# Patient Record
Sex: Female | Born: 1975 | Race: White | Hispanic: No | Marital: Married | Smoking: Never smoker
Health system: Southern US, Community
[De-identification: ages and names within clinical notes are randomized; demographics above are authoritative.]

---

## 2012-11-15 ENCOUNTER — Emergency Department: Payer: Self-pay | Admitting: Internal Medicine

## 2012-11-15 LAB — COMPREHENSIVE METABOLIC PANEL
Albumin: 3 g/dL — ABNORMAL LOW (ref 3.4–5.0)
Alkaline Phosphatase: 57 U/L (ref 50–136)
Anion Gap: 8 (ref 7–16)
Chloride: 106 mmol/L (ref 98–107)
Co2: 24 mmol/L (ref 21–32)
Creatinine: 0.61 mg/dL (ref 0.60–1.30)
Potassium: 3.5 mmol/L (ref 3.5–5.1)
SGOT(AST): 13 U/L — ABNORMAL LOW (ref 15–37)
SGPT (ALT): 14 U/L (ref 12–78)
Total Protein: 6.7 g/dL (ref 6.4–8.2)

## 2012-11-15 LAB — CBC
HCT: 33.1 % — ABNORMAL LOW (ref 35.0–47.0)
HGB: 11.6 g/dL — ABNORMAL LOW (ref 12.0–16.0)
MCH: 31.4 pg (ref 26.0–34.0)
MCV: 90 fL (ref 80–100)
RBC: 3.7 10*6/uL — ABNORMAL LOW (ref 3.80–5.20)
RDW: 13 % (ref 11.5–14.5)
WBC: 9 10*3/uL (ref 3.6–11.0)

## 2012-11-15 LAB — URINALYSIS, COMPLETE
Bilirubin,UR: NEGATIVE
Glucose,UR: NEGATIVE mg/dL (ref 0–75)
Nitrite: NEGATIVE
Ph: 9 (ref 4.5–8.0)
Protein: NEGATIVE
RBC,UR: NONE SEEN /HPF (ref 0–5)
Specific Gravity: 1.004 (ref 1.003–1.030)
Squamous Epithelial: 1
WBC UR: 1 /HPF (ref 0–5)

## 2013-04-18 ENCOUNTER — Inpatient Hospital Stay: Payer: Self-pay | Admitting: Obstetrics and Gynecology

## 2013-04-18 LAB — DRUG SCREEN, URINE
Amphetamines, Ur Screen: NEGATIVE (ref ?–1000)
Barbiturates, Ur Screen: NEGATIVE (ref ?–200)
Benzodiazepine, Ur Scrn: NEGATIVE (ref ?–200)
Cocaine Metabolite,Ur ~~LOC~~: NEGATIVE (ref ?–300)
MDMA (Ecstasy)Ur Screen: NEGATIVE (ref ?–500)
Opiate, Ur Screen: NEGATIVE (ref ?–300)
Phencyclidine (PCP) Ur S: NEGATIVE (ref ?–25)

## 2013-04-18 LAB — CBC WITH DIFFERENTIAL/PLATELET
Basophil #: 0.1 10*3/uL (ref 0.0–0.1)
Basophil %: 0.5 %
Eosinophil #: 0 10*3/uL (ref 0.0–0.7)
Lymphocyte #: 1.3 10*3/uL (ref 1.0–3.6)
Lymphocyte %: 9.5 %
MCV: 91 fL (ref 80–100)
Monocyte %: 6.1 %
Platelet: 140 10*3/uL — ABNORMAL LOW (ref 150–440)
RBC: 3.85 10*6/uL (ref 3.80–5.20)
WBC: 14 10*3/uL — ABNORMAL HIGH (ref 3.6–11.0)

## 2013-04-18 LAB — RAPID HIV-1/2 QL/CONFIRM: HIV-1/2,Rapid Ql: NEGATIVE

## 2013-04-19 LAB — GC/CHLAMYDIA PROBE AMP

## 2014-07-08 LAB — OB RESULTS CONSOLE HEPATITIS B SURFACE ANTIGEN: Hepatitis B Surface Ag: NEGATIVE

## 2014-07-08 LAB — OB RESULTS CONSOLE RPR: RPR: NONREACTIVE

## 2014-07-08 LAB — OB RESULTS CONSOLE HIV ANTIBODY (ROUTINE TESTING): HIV: NONREACTIVE

## 2014-07-08 LAB — OB RESULTS CONSOLE ABO/RH: RH Type: POSITIVE

## 2014-07-08 LAB — OB RESULTS CONSOLE VARICELLA ZOSTER ANTIBODY, IGG: VARICELLA IGG: IMMUNE

## 2014-07-08 LAB — OB RESULTS CONSOLE ANTIBODY SCREEN: ANTIBODY SCREEN: NEGATIVE

## 2014-07-08 LAB — OB RESULTS CONSOLE RUBELLA ANTIBODY, IGM: Rubella: IMMUNE

## 2014-08-19 NOTE — L&D Delivery Note (Signed)
Deliver Note   Date of Delivery:   03/02/2015 Primary OB:   [facility] Gestational Age/EDD: 4851w1d by 03/15/2015, by Last Menstrual Period  Antepartum complications:  OB History    Gravida Para Term Preterm AB TAB SAB Ectopic Multiple Living   4 3        3       Delivered By:   Vena AustriaStaebler, Meesha Sek MD  Delivery Type:   NSVD Anesthesia:     none  Intrapartum complications:  GBS:    Unknown Laceration:    None Episiotomy:    none Placenta:    Spontaneous Estimated Blood Loss:  250mL Baby:    Liveborn female , APGAR (1 MIN):  9 APGAR (5 MINS): 9  APGAR (10 MINS):  , weight pendin   Deliver Details   A liveborn  female was delivered via NSVD (Presentation: OA  ).  APGAR: 9, 9; weight pending.   Placenta status: spontaneous, intact . 3 vessel Cord:  without complications: Marland Kitchen.    Mom to postpartum.  Baby to Couplet care / Skin to Skin.

## 2014-08-28 IMAGING — US US RENAL KIDNEY
1 series · 14 of 25 positions shown · non-contrast
Comparison: none

REASON FOR EXAM: left flank and groin pain
COMMENTS:

PROCEDURE:     US  - US KIDNEY  - November 15, 2012 [DATE]
RESULT:     Bilateral renal ultrasound dated 11/15/2012.
TECHNIQUE: Grayscale, color flow and Doppler, and spectral waveform imaging
was performed the right and left kidneys.

[Series 1: us renal kidney · 0.23mm/px · 14 of 37 slices shown]
[im 1/37]
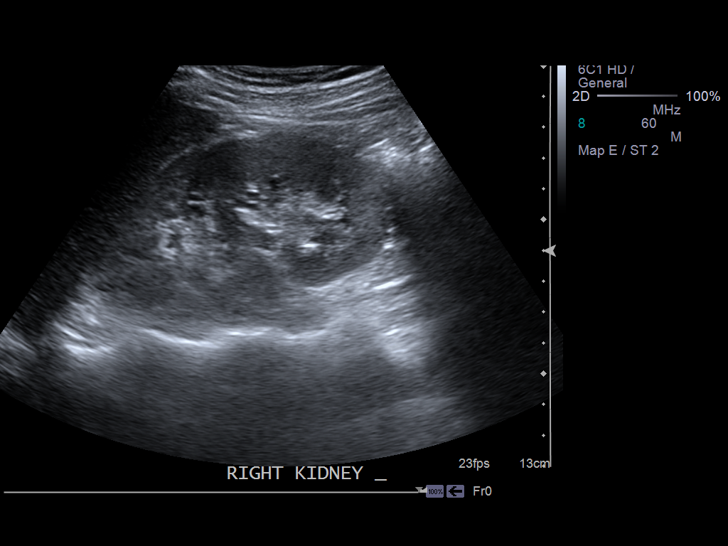
[im 4/37]
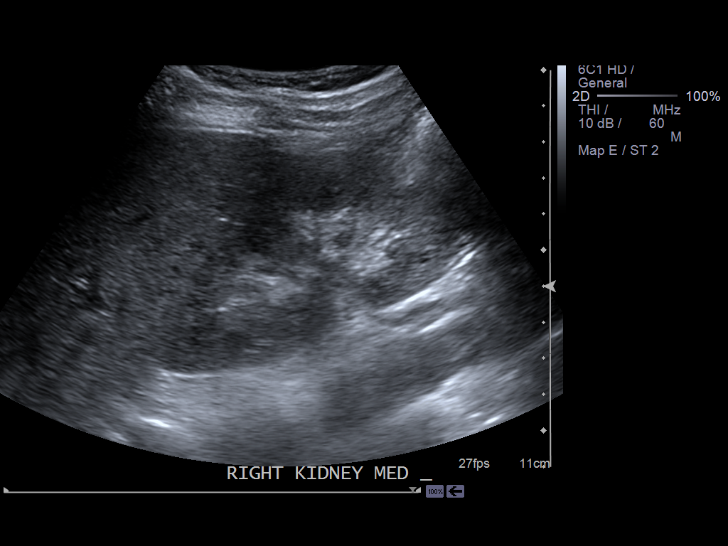
[im 7/37]
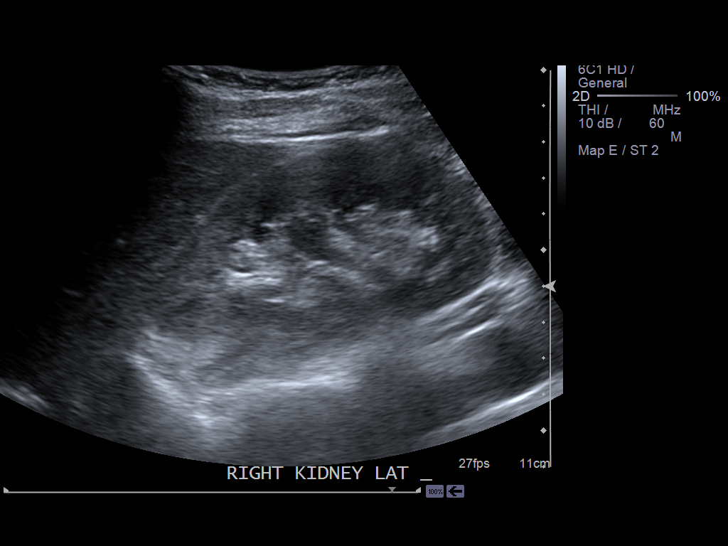
[im 10/37]
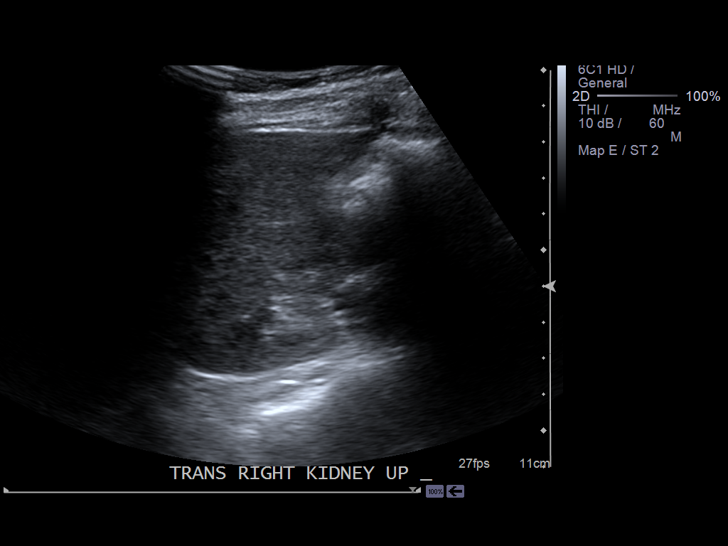
[im 13/37]
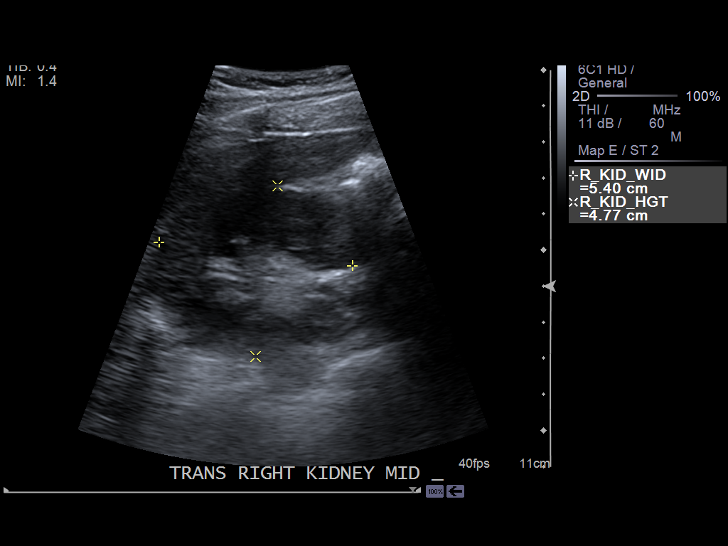
[im 14/37]
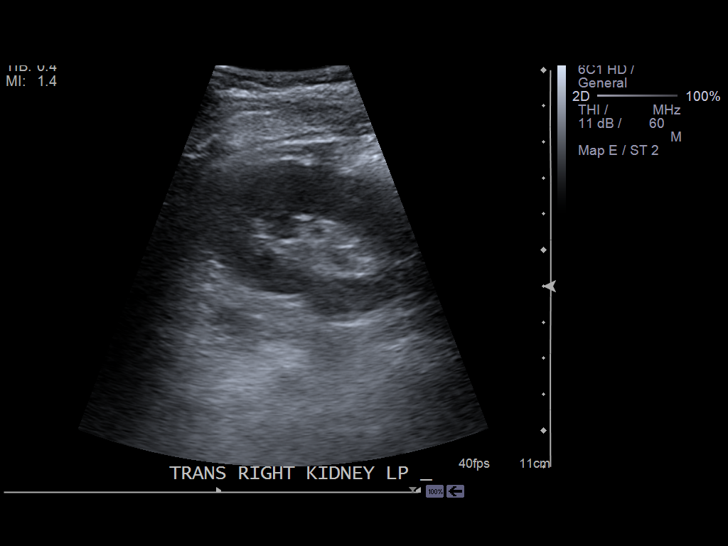
[im 17/37]
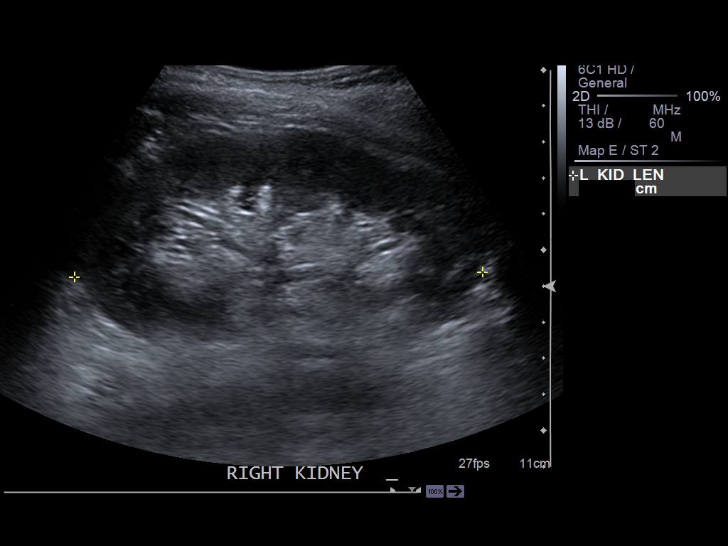
[im 20/37]
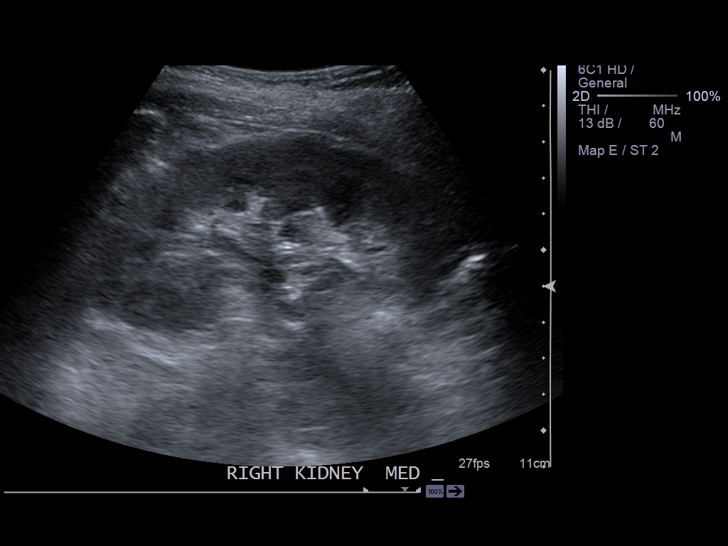
[im 23/37]
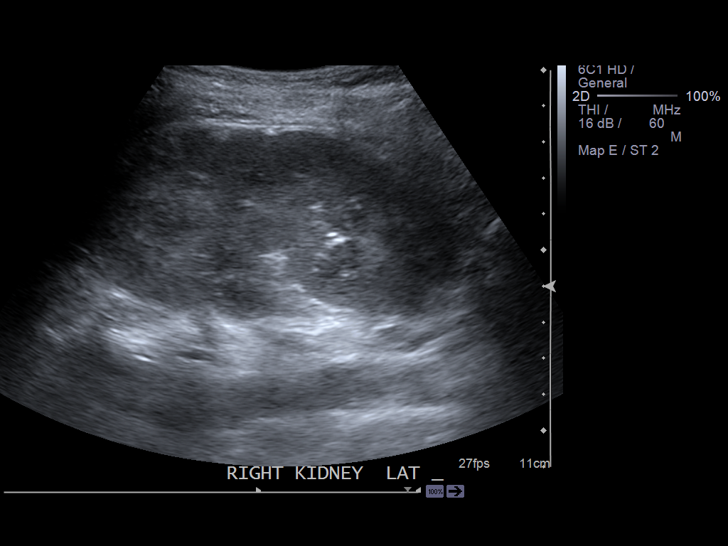
[im 25/37]
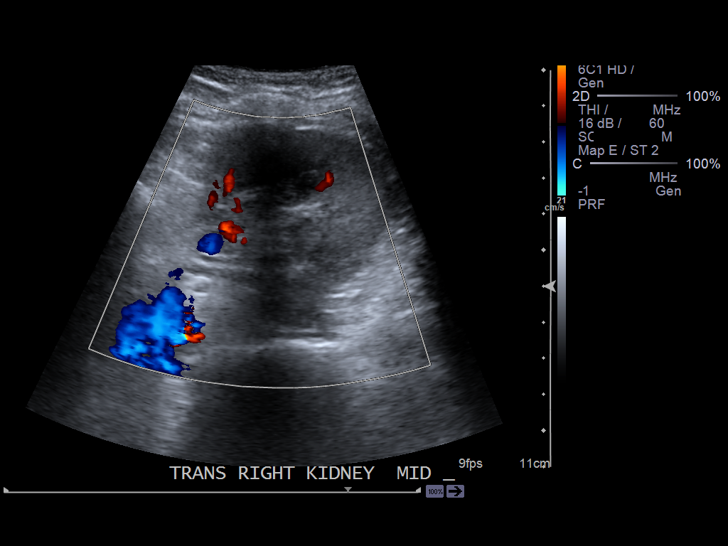
[im 28/37]
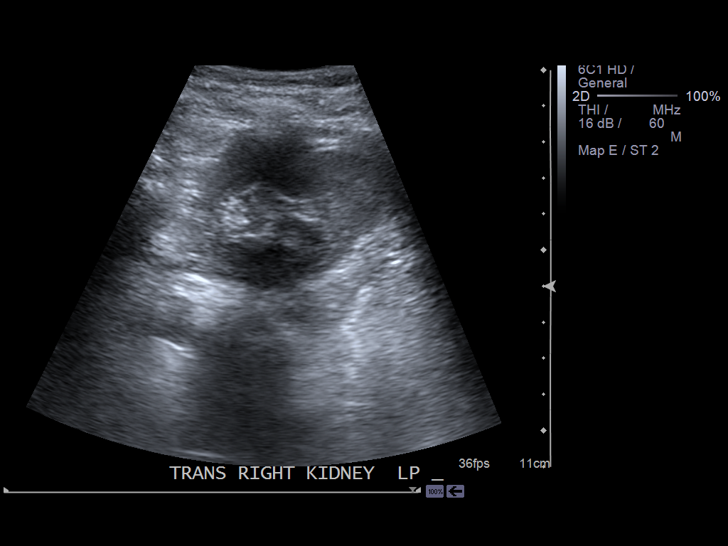
[im 31/37]
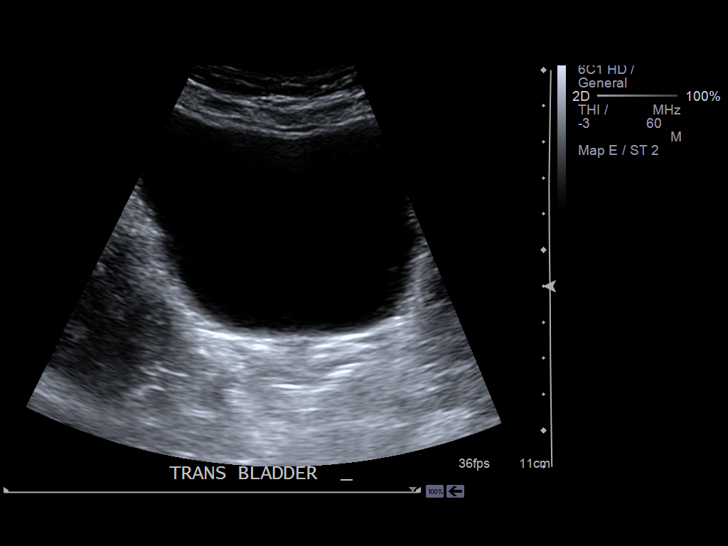
[im 34/37]
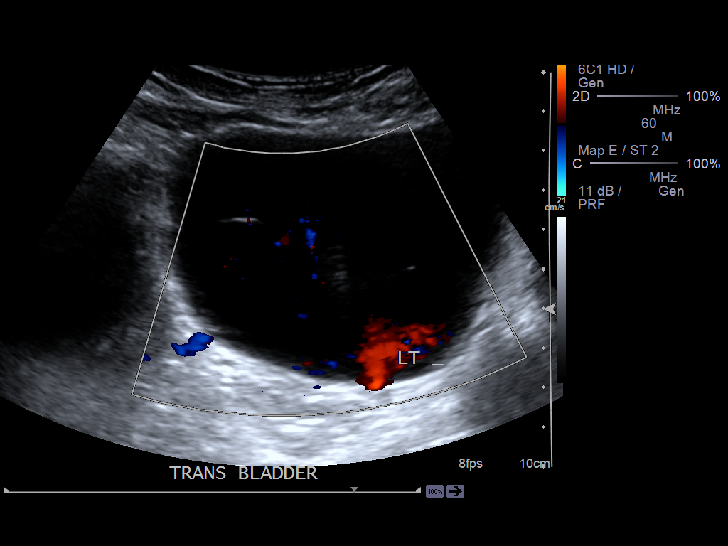
[im 37/37]
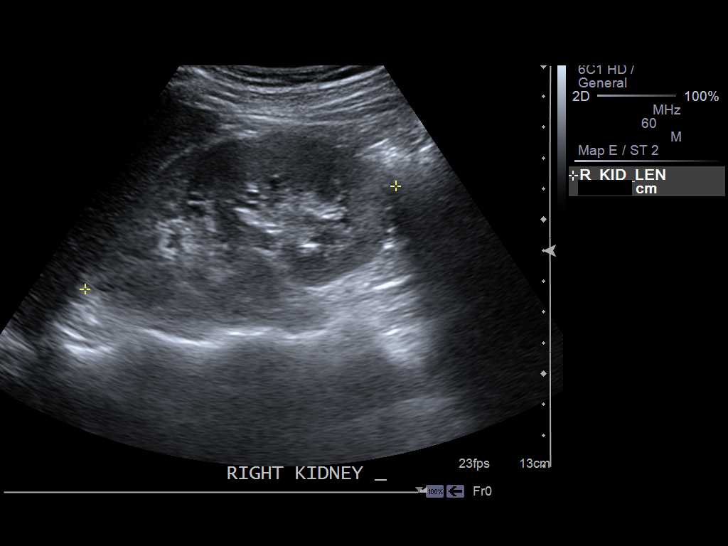

[14 of 25 positions shown; findings below may reference images not displayed]

FINDINGS: The right kidney measures 10.62 x 5.4 x 4.77 cm and the left
x 5.2 x 5.4 cm. The kidneys demonstrate appropriate cortical medullary
differentiation that evidence of hydronephrosis, solid or cystic masses, nor
calculi. Echogenic debris is identified within the urinary bladder.
Bilateral ureteral jets are identified.
IMPRESSION: Echogenic debris within the urinary bladder clinical
correlation recommended. This may ribs and incidental finding though debris
from an etiology such status subtle diagnostic consideration clinical
correlation recommended.
2. Otherwise unremarkable renal ultrasound.

## 2014-12-27 NOTE — H&P (Signed)
L&D Evaluation:  History:  HPI 39 yo G3P2002 at 8366w4d by stated US derived EDC of 04/28/2013 (copy of ultrasound not available for review).  Based on her LMP dating the patient would actually have an EDC of 04/06/13 making her 7445w1d.  The patient presents with contractions starting this morning and increasing in intensity, she has noted some blood show no loss of fluid, +FM.  [facility] but has had a lapse of care/no care in the last 2 months.  PNL: A pos / ABSC neg / RI / VZ unknown / HBsAg neg / HIV neg / unknown GC&CT no 1-hr glucola, no genetic testing, no GBS.  The patient two prior deliveries were uncomplicated vaginal deliveries on at 37 weeks on at 39 weeks largest baby 5lbs 8oz.   Presents with contractions   Patient's Medical History No Chronic Illness   Patient's Surgical History Tonislectomy at age9   Medications Pre Natal Vitamins   Allergies NKDA   Social History none   Family History Non-Contributory   ROS:  ROS All systems were reviewed.  HEENT, CNS, GI, GU, Respiratory, CV, Renal and Musculoskeletal systems were found to be normal.   Exam:  Vital Signs stable   General no apparent distress   Mental Status clear   Chest no increased work of breathing   Abdomen gravid, tender with contractions   Estimated Fetal Weight Average for gestational age   Fetal Position vtx   Edema no edema   Pelvic no external lesions, 6/C/-2   Mebranes Intact   FHT normal rate with no decels   Ucx regular   Impression:  Impression active labor   Plan:  Plan EFM/NST, monitor contractions and for cervical change   Comments 1) Lapse in prenatal care     - GC/CT     - HIV, RPR     - Urine drug screen  2) GBS [facility] at term [facility]     - discussed with pediatrics risk based approach vs proceeding with treatment decision made to start treatment   Electronic Signatures: Lorrene ReidStaebler, Zaryan Yakubov M (MD)  (Signed 31-Aug-14  18:47)  Authored: L&D Evaluation   Last Updated: 31-Aug-14 18:47 by Lorrene ReidStaebler, Tonishia Steffy M (MD)

## 2015-03-02 ENCOUNTER — Other Ambulatory Visit: Payer: Self-pay | Admitting: Obstetrics and Gynecology

## 2015-03-02 ENCOUNTER — Inpatient Hospital Stay
Admission: EM | Admit: 2015-03-02 | Discharge: 2015-03-04 | DRG: 775 | Disposition: A | Discharge status: Home / Self Care | Payer: Medicaid Other | Attending: Obstetrics and Gynecology | Admitting: Obstetrics and Gynecology

## 2015-03-02 DIAGNOSIS — Z3A38 38 weeks gestation of pregnancy: Secondary | ICD-10-CM | POA: Diagnosis not present

## 2015-03-02 DIAGNOSIS — O4292 Full-term premature rupture of membranes, unspecified as to length of time between rupture and onset of labor: Principal | ICD-10-CM | POA: Diagnosis present

## 2015-03-02 DIAGNOSIS — O0933 Supervision of pregnancy with insufficient antenatal care, third trimester: Secondary | ICD-10-CM | POA: Diagnosis not present

## 2015-03-02 DIAGNOSIS — O09523 Supervision of elderly multigravida, third trimester: Secondary | ICD-10-CM | POA: Diagnosis not present

## 2015-03-02 DIAGNOSIS — O4290 Premature rupture of membranes, unspecified as to length of time between rupture and onset of labor, unspecified weeks of gestation: Secondary | ICD-10-CM | POA: Diagnosis present

## 2015-03-02 LAB — CBC
HCT: 35.4 % (ref 35.0–47.0)
HCT: 33.1 % — ABNORMAL LOW (ref 35.0–47.0)
Hemoglobin: 11.5 g/dL — ABNORMAL LOW (ref 12.0–16.0)
Hemoglobin: 10.9 g/dL — ABNORMAL LOW (ref 12.0–16.0)
MCH: 29.5 pg (ref 26.0–34.0)
MCH: 29.8 pg (ref 26.0–34.0)
MCHC: 32.4 g/dL (ref 32.0–36.0)
MCHC: 32.9 g/dL (ref 32.0–36.0)
MCV: 91.2 fL (ref 80.0–100.0)
MCV: 90.5 fL (ref 80.0–100.0)
PLATELETS: 134 10*3/uL — AB (ref 150–440)
Platelets: 151 10*3/uL (ref 150–440)
RBC: 3.88 MIL/uL (ref 3.80–5.20)
RBC: 3.66 MIL/uL — ABNORMAL LOW (ref 3.80–5.20)
RDW: 13.4 % (ref 11.5–14.5)
RDW: 13.5 % (ref 11.5–14.5)
WBC: 11.6 10*3/uL — ABNORMAL HIGH (ref 3.6–11.0)
WBC: 14.3 10*3/uL — AB (ref 3.6–11.0)

## 2015-03-02 LAB — URINE DRUG SCREEN, QUALITATIVE (ARMC ONLY)
Amphetamines, Ur Screen: NOT DETECTED
BARBITURATES, UR SCREEN: NOT DETECTED
BENZODIAZEPINE, UR SCRN: NOT DETECTED
Cannabinoid 50 Ng, Ur ~~LOC~~: NOT DETECTED
Cocaine Metabolite,Ur ~~LOC~~: NOT DETECTED
MDMA (Ecstasy)Ur Screen: NOT DETECTED
METHADONE SCREEN, URINE: NOT DETECTED
Opiate, Ur Screen: NOT DETECTED
Phencyclidine (PCP) Ur S: NOT DETECTED
TRICYCLIC, UR SCREEN: NOT DETECTED

## 2015-03-02 LAB — TYPE AND SCREEN
ABO/RH(D): A POS
Antibody Screen: NEGATIVE

## 2015-03-02 LAB — CHLAMYDIA/NGC RT PCR (ARMC ONLY)
Chlamydia Tr: NOT DETECTED
N GONORRHOEAE: NOT DETECTED

## 2015-03-02 MED ORDER — ONDANSETRON HCL 4 MG/2ML IJ SOLN: 4.0000 mg | INTRAMUSCULAR | Status: DC | PRN

## 2015-03-02 MED ORDER — DIBUCAINE 1 % RE OINT: 1.0000 "application " | TOPICAL_OINTMENT | RECTAL | Status: DC | PRN

## 2015-03-02 MED ORDER — BENZOCAINE-MENTHOL 20-0.5 % EX AERO: 1.0000 "application " | INHALATION_SPRAY | CUTANEOUS | Status: DC | PRN

## 2015-03-02 MED ORDER — ONDANSETRON HCL 4 MG/2ML IJ SOLN: 4.0000 mg | Freq: Four times a day (QID) | INTRAMUSCULAR | Status: DC | PRN

## 2015-03-02 MED ORDER — SODIUM CHLORIDE 0.9 % IV SOLN
1.0000 g | INTRAVENOUS | Status: DC
  Filled 2015-03-02 (×2): qty 1000

## 2015-03-02 MED ORDER — SODIUM CHLORIDE 0.9 % IV SOLN
INTRAVENOUS | Status: AC
  Filled 2015-03-02: qty 2000

## 2015-03-02 MED ORDER — MISOPROSTOL 200 MCG PO TABS
ORAL_TABLET | ORAL | Status: AC
  Filled 2015-03-02: qty 4

## 2015-03-02 MED ORDER — SIMETHICONE 80 MG PO CHEW: 80.0000 mg | CHEWABLE_TABLET | ORAL | Status: DC | PRN

## 2015-03-02 MED ORDER — OXYCODONE-ACETAMINOPHEN 5-325 MG PO TABS: 2.0000 | ORAL_TABLET | ORAL | Status: DC | PRN

## 2015-03-02 MED ORDER — ONDANSETRON HCL 4 MG PO TABS: 4.0000 mg | ORAL_TABLET | ORAL | Status: DC | PRN

## 2015-03-02 MED ORDER — OXYTOCIN BOLUS FROM INFUSION
500.0000 mL | INTRAVENOUS | Status: DC
  Administered 2015-03-02: 500 mL via INTRAVENOUS

## 2015-03-02 MED ORDER — LIDOCAINE HCL (PF) 1 % IJ SOLN
30.0000 mL | INTRAMUSCULAR | Status: DC | PRN
  Filled 2015-03-02: qty 30

## 2015-03-02 MED ORDER — OXYCODONE-ACETAMINOPHEN 5-325 MG PO TABS: 1.0000 | ORAL_TABLET | ORAL | Status: DC | PRN

## 2015-03-02 MED ORDER — LACTATED RINGERS IV SOLN: 500.0000 mL | INTRAVENOUS | Status: DC | PRN

## 2015-03-02 MED ORDER — WITCH HAZEL-GLYCERIN EX PADS: 1.0000 "application " | MEDICATED_PAD | CUTANEOUS | Status: DC | PRN

## 2015-03-02 MED ORDER — LIDOCAINE HCL (PF) 1 % IJ SOLN
INTRAMUSCULAR | Status: AC
  Filled 2015-03-02: qty 30

## 2015-03-02 MED ORDER — OXYTOCIN 10 UNIT/ML IJ SOLN
INTRAMUSCULAR | Status: AC
  Filled 2015-03-02: qty 2

## 2015-03-02 MED ORDER — PRENATAL MULTIVITAMIN CH
1.0000 | ORAL_TABLET | Freq: Every day | ORAL | Status: DC
  Administered 2015-03-03 – 2015-03-04 (×2): 1 via ORAL
  Filled 2015-03-02 (×2): qty 1

## 2015-03-02 MED ORDER — ACETAMINOPHEN 325 MG PO TABS: 650.0000 mg | ORAL_TABLET | ORAL | Status: DC | PRN

## 2015-03-02 MED ORDER — CITRIC ACID-SODIUM CITRATE 334-500 MG/5ML PO SOLN: 30.0000 mL | ORAL | Status: DC | PRN

## 2015-03-02 MED ORDER — TETANUS-DIPHTH-ACELL PERTUSSIS 5-2.5-18.5 LF-MCG/0.5 IM SUSP: 0.5000 mL | Freq: Once | INTRAMUSCULAR | Status: DC

## 2015-03-02 MED ORDER — SENNOSIDES-DOCUSATE SODIUM 8.6-50 MG PO TABS: 2.0000 | ORAL_TABLET | ORAL | Status: DC

## 2015-03-02 MED ORDER — LACTATED RINGERS IV SOLN
INTRAVENOUS | Status: DC
  Administered 2015-03-02: 03:00:00 via INTRAVENOUS

## 2015-03-02 MED ORDER — SODIUM CHLORIDE 0.9 % IV SOLN: 2.0000 g | Freq: Once | INTRAVENOUS | Status: DC

## 2015-03-02 MED ORDER — DIPHENHYDRAMINE HCL 25 MG PO CAPS: 25.0000 mg | ORAL_CAPSULE | Freq: Four times a day (QID) | ORAL | Status: DC | PRN

## 2015-03-02 MED ORDER — OXYTOCIN 40 UNITS IN LACTATED RINGERS INFUSION - SIMPLE MED
INTRAVENOUS | Status: AC
  Filled 2015-03-02: qty 1000

## 2015-03-02 MED ORDER — LANOLIN HYDROUS EX OINT: TOPICAL_OINTMENT | CUTANEOUS | Status: DC | PRN

## 2015-03-02 MED ORDER — OXYTOCIN 40 UNITS IN LACTATED RINGERS INFUSION - SIMPLE MED: 62.5000 mL/h | INTRAVENOUS | Status: DC

## 2015-03-02 MED ORDER — IBUPROFEN 600 MG PO TABS
600.0000 mg | ORAL_TABLET | Freq: Four times a day (QID) | ORAL | Status: DC
  Filled 2015-03-02: qty 1

## 2015-03-02 MED ORDER — AMMONIA AROMATIC IN INHA
RESPIRATORY_TRACT | Status: AC
  Filled 2015-03-02: qty 10

## 2015-03-02 NOTE — H&P (Signed)
Obstetric H&P   Chief Complaint: Leaking fluid  [facility] and [facility])  History of Present Illness: 39 y.o. G4P3003 at 38 weeks 1 day by 20 week US derived EDC of 03/15/2015 presenting with LOF 03/01/15, contractions.  +FM, no VB.  Other pregnancies uncomplicated [facility]  PNC noteable for AMA,   CF screening negative A positive Rub imm, Varicella imm Pap Neg, HPV neg GBS unknown No genetic testing   Review of Systems: 10 point review of systems negative unless otherwise noted in HPI  Past Medical History: No past medical history on file.  Past Surgical History: No past surgical history on file.  Family History: No family history on file.  Social History: History   Social History  . Marital Status: Married    Spouse Name: N/A  . Number of Children: N/A  . Years of Education: N/A   Occupational History  . Not on file.   Social History Main Topics  . Smoking status: Not on file  . Smokeless tobacco: Not on file  . Alcohol Use: Not on file  . Drug Use: Not on file  . Sexual Activity: Not on file   Other Topics Concern  . Not on file   Social History Narrative  . No narrative on file    Medications: Prior to Admission medications   Not on File    Allergies: Allergies not on file  Physical Exam: Filed Vitals:   03/02/15 0157  BP: 98/54  Pulse: 71    FHT: 130, mod, +accles, no decels Toco: irregular  General: NAD, cachetic HEENT: normocephalic anicteric Pulmonary: no increased work of breathing Abdomen: Gravid,  Non-tender Genitourinary:6cm per nursing staff Extremities: no edema  Labs: No results found for this or any previous visit (from the past 24 hour(s)).  Assessment: 39 y.o. [id number] no/limited PNC AMA, S<D presenting term labor SROM  Plan: 1) Labor - expectant managment  2) Fetus - category I tracing  3) TDAP - needs  4) Disposition - pending  delivery

## 2015-03-02 NOTE — Discharge Summary (Signed)
Obstetric Discharge Summary Reason for Admission: onset of labor Prenatal Procedures: NST Intrapartum Procedures: spontaneous vaginal delivery Postpartum Procedures: none Complications-Operative and Postpartum: none   Hospital course: Admitted in labor. Went on to have spontaneous vaginal delivery.  Uncomplicated postpartum course.  Meeting discharge criteria on postpartum day #2.  Declines contraception.  She is ambulating, tolerating an oral diet, her pain is well-controlled on oral medication, and she is voiding without difficulty.  She is  Quite anxious to be discharged.  HEMOGLOBIN  Date Value Ref Range Status  03/02/2015 10.9* 12.0 - 16.0 g/dL Final   HGB  Date Value Ref Range Status  04/18/2013 11.8* 12.0-16.0 g/dL Final   HCT  Date Value Ref Range Status  03/02/2015 33.1* 35.0 - 47.0 % Final  04/19/2013 32.8* 35.0-47.0 % Final    Physical Exam:  BP 112/59 mmHg  Pulse 64  Temp(Src) 98.5 F (36.9 C) (Oral)  Resp 18  Ht 5\' 3"  (1.6 m)  Wt 140 lb (63.504 kg)  BMI 24.81 kg/m2  SpO2 99%  LMP 06/08/2014  General: alert, no distress and thin Lochia: appropriate Uterine Fundus: firm at umbilicus Incision: n/a DVT Evaluation: No evidence of DVT seen on physical exam.  Discharge Diagnoses: Term Pregnancy-delivered  Discharge Information: Date: 03/04/2015 Activity: pelvic rest Diet: routine Medications: Ibuprofen Condition: stable Instructions: refer to practice specific booklet Discharge to: home Follow-up Information    Follow up with Lorrene ReidSTAEBLER, ANDREAS M, MD. Call in 6 weeks.   Specialty:  Obstetrics and Gynecology   Why:  call next week to schedule 6-week postpartum appointment   Contact information:   [address] [place] [address] [phone]       Newborn Data: Live born female Birth Weight:   APGAR: ,   Home with mother.  Conard NovakJackson, Stephen D, MD 03/04/2015, 11:44 AM

## 2015-03-02 NOTE — Progress Notes (Signed)
Admit Date: 03/02/2015 Today's Date: 03/02/2015  Post Partum Day 0+  Subjective:  no complaints, up ad lib, voiding and tolerating PO  Objective: Temp:  [97.4 F (36.3 C)-98.4 F (36.9 C)] 97.7 F (36.5 C) (07/14 1141) Pulse Rate:  [53-71] 57 (07/14 1141) Resp:  [18-20] 18 (07/14 1141) BP: (96-127)/(42-79) 102/50 mmHg (07/14 1141) SpO2:  [99 %-100 %] 99 % (07/14 0730)  Physical Exam:  General: alert, cooperative and appears stated age Lochia: appropriate Uterine Fundus: firm Incision: none DVT Evaluation: No evidence of DVT seen on physical exam.   Recent Labs  03/02/15 0228 03/02/15 0826  HGB 11.5* 10.9*  HCT 35.4 33.1*    Assessment/Plan: Breastfeeding and Infant doing well.  48 hour stay for infant due to size.  Amb and diet.    LOS: 0 days   Rebecca Haas [facility] 03/02/2015, 1:41 PM

## 2015-03-03 LAB — RPR: RPR Ser Ql: NONREACTIVE

## 2015-03-03 LAB — HIV ANTIBODY (ROUTINE TESTING W REFLEX): HIV Screen 4th Generation wRfx: NONREACTIVE

## 2015-03-03 MED ORDER — IBUPROFEN 600 MG PO TABS: 600.0000 mg | ORAL_TABLET | Freq: Four times a day (QID) | ORAL | Status: DC | PRN

## 2015-03-03 NOTE — Progress Notes (Signed)
Daily Post Partum Note  Rebecca Haas is a 39 y.o. [id number] PPD#1 s/p  TSVD/IP  @ 8767w2d  Pregnancy c/b scant PNC, AMA  24hr/overnight events:  none  Subjective:  Meeting all PP goals  Objective:    Current Vital Signs 24h Vital Sign Ranges  T 97.7 F (36.5 C) Temp  Avg: 97.8 F (36.6 C)  Min: 97.7 F (36.5 C)  Max: 98 F (36.7 C)  BP 115/61 mmHg BP  Min: 102/50  Max: 115/61  HR (!) 54 Pulse  Avg: 57.8  Min: 54  Max: 64  RR 18 Resp  Avg: 19  Min: 18  Max: 20  SaO2 99 %   No Data Recorded       24 Hour I/O Current Shift I/O  Time Ins Outs 07/14 0701 - 07/15 0700 In: 240 [P.O.:240] Out: -       General: NAD Abdomen: nttp, ff below the umbilicus Perineum: deferred Skin:  Warm and dry.  Cardiovascular:Regular rate and rhythm. Respiratory:  Clear to auscultation bilateral. Normal respiratory effort Extremities: no c/c/e  Medications Current Facility-Administered Medications  Medication Dose Route Frequency Provider Last Rate Last Dose  . acetaminophen (TYLENOL) tablet 650 mg  650 mg Oral Q4H PRN Vena AustriaAndreas Staebler, MD      . benzocaine-Menthol (DERMOPLAST) 20-0.5 % topical spray 1 application  1 application Topical PRN Vena AustriaAndreas Staebler, MD      . witch hazel-glycerin (TUCKS) pad 1 application  1 application Topical PRN Vena AustriaAndreas Staebler, MD       And  . dibucaine (NUPERCAINAL) 1 % rectal ointment 1 application  1 application Rectal PRN Vena AustriaAndreas Staebler, MD      . diphenhydrAMINE (BENADRYL) capsule 25 mg  25 mg Oral Q6H PRN Vena AustriaAndreas Staebler, MD      . ibuprofen (ADVIL,MOTRIN) tablet 600 mg  600 mg Oral 4 times per day Vena AustriaAndreas Staebler, MD      . lanolin ointment   Topical PRN Vena AustriaAndreas Staebler, MD      . ondansetron [facility](ZOFRAN) tablet 4 mg  4 mg Oral Q4H PRN Vena AustriaAndreas Staebler, MD      . oxyCODONE-acetaminophen (PERCOCET/ROXICET) 5-325 MG per tablet 1 tablet  1 tablet Oral Q4H PRN Vena AustriaAndreas Staebler, MD      . oxyCODONE-acetaminophen (PERCOCET/ROXICET) 5-325 MG per tablet 2 tablet  2  tablet Oral Q4H PRN Vena AustriaAndreas Staebler, MD      . prenatal multivitamin tablet 1 tablet  1 tablet Oral Q1200 Vena AustriaAndreas Staebler, MD      . senna-docusate (Senokot-S) tablet 2 tablet  2 tablet Oral Q24H Vena AustriaAndreas Staebler, MD   2 tablet at 03/03/15 0000  . simethicone (MYLICON) chewable tablet 80 mg  80 mg Oral PRN Vena AustriaAndreas Staebler, MD      . Tdap Leda Min(BOOSTRIX) injection 0.5 mL  0.5 mL Intramuscular Once Vena AustriaAndreas Staebler, MD        Labs:   Recent Labs Lab 03/02/15 0228 03/02/15 0826  WBC 11.6* 14.3*  HGB 11.5* 10.9*  HCT 35.4 33.1*  PLT 151 134*   No results for input(s): NA, K, CL, CO2, BUN, CREATININE, LABGLOM, GLUCOSE, CALCIUM in the last 168 hours.  Radiology: none  Assessment & Plan:  Pt doing well *Postpartum/postop: routine care. BC d/w pt and she declines. Breast feeding. *scant PNC: negative UDS on admission *dispo: pt told that if okay with peds for baby to go home today, i'm fine with her being discharged   Cornelia Copaharlie Elleah Hemsley, Jr. MD [facility] Pager [phone]

## 2015-03-04 NOTE — Progress Notes (Signed)
Discharge instructions provided.  Pt and sig other verbalize understanding of all instructions and follow-up care.  Pt discharged to home with infant at 1452 on 03/04/15 via wheelchair by CNA. Reynold BowenSusan Paisley Shaarav Ripple, RN 03/04/2015 3:31 PM

## 2015-03-04 NOTE — Discharge Instructions (Signed)
Call your doctor for increased pain or vaginal bleeding, temperature above 100.4, depression, or concerns.  Increase calories and fluids while breastfeeding.  Continue prenatal vitamin and iron for 6 weeks. No intercourse, tampons, douching, or enemas for 6 weeks.  No tub baths- showers only.  No strenuous activity or heavy lifting for 6 weeks.  No driving for 2 weeks or while taking pain medications.

## 2015-03-04 NOTE — Progress Notes (Signed)
Education provided on need for TDaP vaccine.  Pt declines vaccine at this time. Reynold BowenSusan Paisley Keren Alverio, RN 03/04/2015 1:52 PM

## 2016-01-05 ENCOUNTER — Encounter (HOSPITAL_COMMUNITY): Payer: Self-pay | Admitting: *Deleted
